# Patient Record
Sex: Male | Born: 1940 | Race: Black or African American | Hispanic: No | Smoking: Never smoker
Health system: Southern US, Community
[De-identification: ages and names within clinical notes are randomized; demographics above are authoritative.]

## PROBLEM LIST (undated history)

## (undated) DIAGNOSIS — K449 Diaphragmatic hernia without obstruction or gangrene: Secondary | ICD-10-CM

## (undated) DIAGNOSIS — F329 Major depressive disorder, single episode, unspecified: Secondary | ICD-10-CM

## (undated) DIAGNOSIS — F32A Depression, unspecified: Secondary | ICD-10-CM

## (undated) DIAGNOSIS — I10 Essential (primary) hypertension: Secondary | ICD-10-CM

## (undated) DIAGNOSIS — H269 Unspecified cataract: Secondary | ICD-10-CM

## (undated) DIAGNOSIS — I341 Nonrheumatic mitral (valve) prolapse: Secondary | ICD-10-CM

## (undated) DIAGNOSIS — C801 Malignant (primary) neoplasm, unspecified: Secondary | ICD-10-CM

## (undated) HISTORY — DX: Nonrheumatic mitral (valve) prolapse: I34.1

## (undated) HISTORY — DX: Unspecified cataract: H26.9

## (undated) HISTORY — DX: Diaphragmatic hernia without obstruction or gangrene: K44.9

## (undated) HISTORY — DX: Essential (primary) hypertension: I10

## (undated) HISTORY — DX: Depression, unspecified: F32.A

## (undated) HISTORY — DX: Major depressive disorder, single episode, unspecified: F32.9

## (undated) HISTORY — DX: Malignant (primary) neoplasm, unspecified: C80.1

---

## 2014-04-22 ENCOUNTER — Inpatient Hospital Stay: Payer: Self-pay | Admitting: Internal Medicine

## 2014-06-12 ENCOUNTER — Ambulatory Visit: Payer: Self-pay | Admitting: Radiation Oncology

## 2014-06-18 ENCOUNTER — Ambulatory Visit: Payer: Self-pay | Admitting: Radiation Oncology

## 2014-06-19 ENCOUNTER — Encounter: Payer: Self-pay | Admitting: *Deleted

## 2014-06-24 ENCOUNTER — Ambulatory Visit (INDEPENDENT_AMBULATORY_CARE_PROVIDER_SITE_OTHER): Payer: Medicare Other | Admitting: General Surgery

## 2014-06-24 ENCOUNTER — Encounter: Payer: Self-pay | Admitting: General Surgery

## 2014-06-24 VITALS — BP 160/62 | HR 57 | Resp 20 | Ht 68.0 in | Wt 136.0 lb

## 2014-06-24 DIAGNOSIS — C20 Malignant neoplasm of rectum: Secondary | ICD-10-CM

## 2014-06-24 MED ORDER — BENZONATATE 100 MG PO CAPS: ORAL_CAPSULE | ORAL | Status: AC

## 2014-06-24 NOTE — Progress Notes (Signed)
Patient ID: James Luna, male   DOB: [birth year], 73 y.o.   MRN: [id number]  Chief Complaint  Patient presents with  . Other    evaluation for port placement    HPI James Luna is a 73 y.o. male who presents for an evaluation of a port placement.   HPI  Past Medical History  Diagnosis Date  . Cancer     colon  . Cataract   . Hypertension   . Depression   . Hiatal hernia   . Mitral valve prolapse     History reviewed. No pertinent past surgical history.  History reviewed. No pertinent family history.  Social History History  Substance Use Topics  . Smoking status: Never Smoker   . Smokeless tobacco: Not on file  . Alcohol Use: No    No Known Allergies  Current Outpatient Prescriptions  Medication Sig Dispense Refill  . b complex vitamins tablet Take 1 tablet by mouth daily.      . furosemide (LASIX) 20 MG tablet 2 tabs by mouth in AM 1 tab in PM      . glipiZIDE (GLUCOTROL) 5 MG tablet Take 5 mg by mouth daily before breakfast.      . latanoprost (XALATAN) 0.005 % ophthalmic solution Place 1 drop into the right eye at bedtime.      Marland Kitchen lisinopril (PRINIVIL,ZESTRIL) 40 MG tablet Take 40 mg by mouth daily.      . metFORMIN (GLUCOPHAGE) 1000 MG tablet Take 1,000 mg by mouth 2 (two) times daily with a meal.      . Multiple Vitamins-Minerals (MULTIVITAMIN PO) Take 1 tablet by mouth daily.      Marland Kitchen POTASSIUM CHLORIDE ER PO Take 10 mEq by mouth daily.      . timolol (BETIMOL) 0.25 % ophthalmic solution Place 1 drop into both eyes daily.      . traMADol (ULTRAM) 50 MG tablet Take 50 mg by mouth every 6 (six) hours as needed.      . benzonatate (TESSALON PERLES) 100 MG capsule Take two pills twice daily  50 capsule  0   No current facility-administered medications for this visit.    Review of Systems Review of Systems  Constitutional: Negative.   Respiratory: Negative.   Cardiovascular: Negative.     Blood pressure 160/62, pulse 57, resp. rate 20, height 5\' 8"   (1.727 m), weight 136 lb (61.689 kg), SpO2 83.00%.  Physical Exam Physical Exam  Constitutional: He is oriented to person, place, and time. He appears well-developed and well-nourished.  Eyes: Conjunctivae are normal. No scleral icterus.  Neck: Neck supple. No thyromegaly present.  Cardiovascular: Normal rate and regular rhythm.   Murmur heard.  Systolic murmur is present with a grade of 5/6  Murmur is best heard over the apex.  Pulmonary/Chest: Breath sounds normal. Accessory muscle usage present.  Abdominal: Soft. Normal appearance and bowel sounds are normal. There is no tenderness.  Lymphadenopathy:    He has no cervical adenopathy.  Neurological: He is alert and oriented to person, place, and time.  Skin: Skin is warm and dry.    Data Reviewed Dr Metro Kung notes, prior PET/CT  Assessment    Pt has a rectal Cancer- reported 2 cm from anal verge, non obstructing, T2,N0,M0. It has ben decided to go with chemo/radiation for now. Port explaine to pt and he is agreeable.    Plan    Patient's surgery has been scheduled for 06-27-14 at [facility].  Anoushka Divito G 06/24/2014, 3:57 PM

## 2014-06-24 NOTE — Patient Instructions (Addendum)
Patient to be scheduled for port placement. The patient is aware to call back for any questions or concerns.  This patient's surgery has been scheduled for 06-27-14 at [facility].

## 2014-06-25 ENCOUNTER — Ambulatory Visit: Payer: Self-pay | Admitting: General Surgery

## 2014-06-25 ENCOUNTER — Telehealth: Payer: Self-pay | Admitting: *Deleted

## 2014-06-25 ENCOUNTER — Ambulatory Visit: Payer: Self-pay | Admitting: Radiation Oncology

## 2014-06-25 NOTE — Telephone Encounter (Signed)
James Luna from [employer] called and had just seen James Luna and they had a list of his medication but when they went over it he stated there was some he did not take anymore. James Luna stated that he seems confused so she feels the best thing for him to do before surgery is not take any medication at all. She told him that and he is aware to not take any medication prior to his surgery.

## 2014-06-27 ENCOUNTER — Encounter: Payer: Self-pay | Admitting: *Deleted

## 2014-06-27 ENCOUNTER — Encounter: Payer: Self-pay | Admitting: General Surgery

## 2014-06-27 ENCOUNTER — Ambulatory Visit: Payer: Self-pay | Admitting: General Surgery

## 2014-06-27 DIAGNOSIS — C20 Malignant neoplasm of rectum: Secondary | ICD-10-CM

## 2014-07-04 ENCOUNTER — Encounter: Payer: Self-pay | Admitting: General Surgery

## 2014-07-08 ENCOUNTER — Ambulatory Visit: Payer: Medicare Other | Admitting: General Surgery

## 2014-07-25 NOTE — Telephone Encounter (Signed)
error 

## 2014-10-13 ENCOUNTER — Other Ambulatory Visit: Payer: Self-pay

## 2014-10-14 ENCOUNTER — Other Ambulatory Visit: Payer: Self-pay | Admitting: Physician Assistant

## 2014-10-14 DIAGNOSIS — I959 Hypotension, unspecified: Secondary | ICD-10-CM

## 2014-10-14 DIAGNOSIS — R7989 Other specified abnormal findings of blood chemistry: Secondary | ICD-10-CM

## 2014-10-14 DIAGNOSIS — I4891 Unspecified atrial fibrillation: Secondary | ICD-10-CM

## 2014-10-14 DIAGNOSIS — I442 Atrioventricular block, complete: Secondary | ICD-10-CM

## 2014-10-14 DIAGNOSIS — I340 Nonrheumatic mitral (valve) insufficiency: Secondary | ICD-10-CM

## 2014-10-25 DEATH — deceased

## 2014-12-09 IMAGING — CT NM PET TUM IMG INITIAL (PI) SKULL BASE T - THIGH
10 series · 25 of 25 positions shown · non-contrast
Comparison: Clinic note of 06/12/2014.

CLINICAL DATA: Initial treatment strategy for rectal cancer initial
staging..

EXAM:
NUCLEAR MEDICINE PET SKULL BASE TO THIGH
TECHNIQUE: 12.4 mCi F-18 FDG was injected intravenously. Full-ring PET imaging
was performed from the skull base to thigh after the radiotracer. CT
data was obtained and used for attenuation correction and anatomic
localization.
FASTING BLOOD GLUCOSE:  Value: 109 mg/dl

[Series 3: ct wb 5.0 b30f · axial · 5.0mm · 0.98mm/px · z∈[-1412,-546]mm · 4 of 290 slices shown]
[im 1/290]
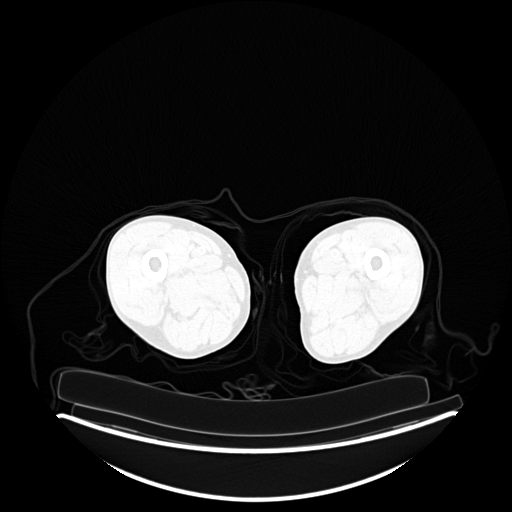
[im 97/290]
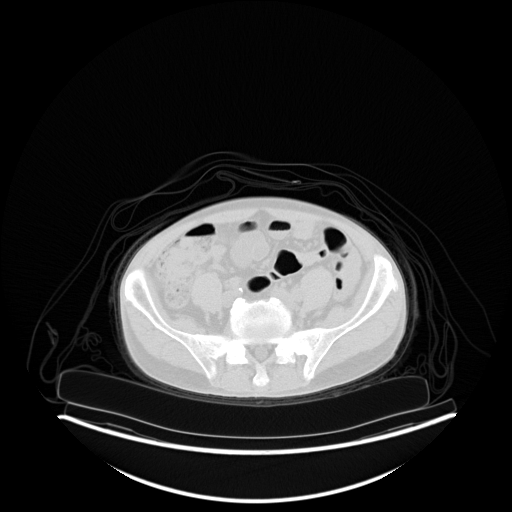
[im 193/290]
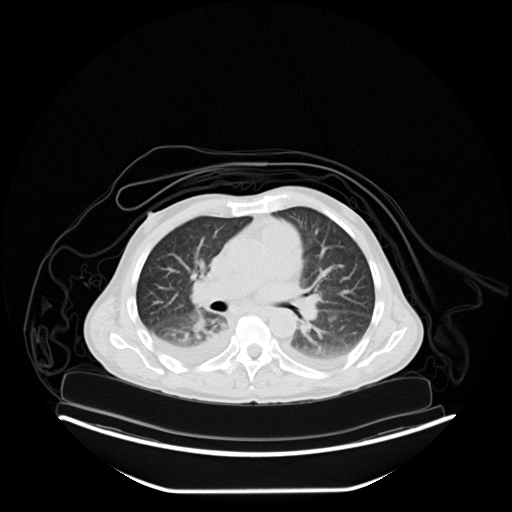
[im 290/290  brain]
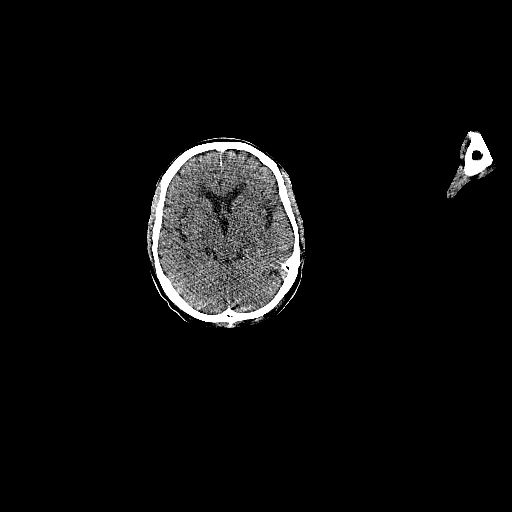

[Series 4: pet wb (ac) · axial · 5.0mm · 4.07mm/px · z∈[-1412,-546]mm · 4 of 290 slices shown]
[im 1/290]
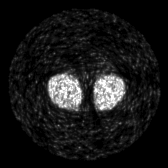
[im 97/290]
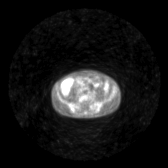
[im 193/290]
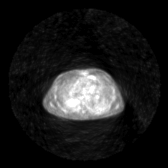
[im 290/290]
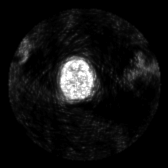

[Series 5: pet wb uncorrected (nac) · axial · 5.0mm · 4.07mm/px · z∈[-1412,-546]mm · 4 of 290 slices shown]
[im 1/290]
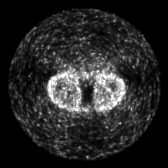
[im 97/290]
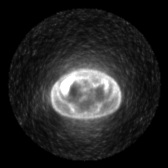
[im 193/290]
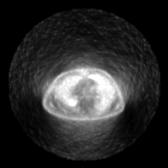
[im 290/290]
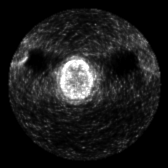

[Series 603: pet axial · 4 of 290 slices shown]
[im 1/290]
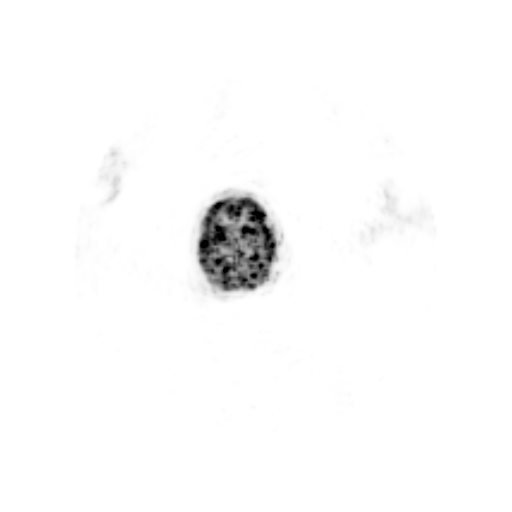
[im 97/290]
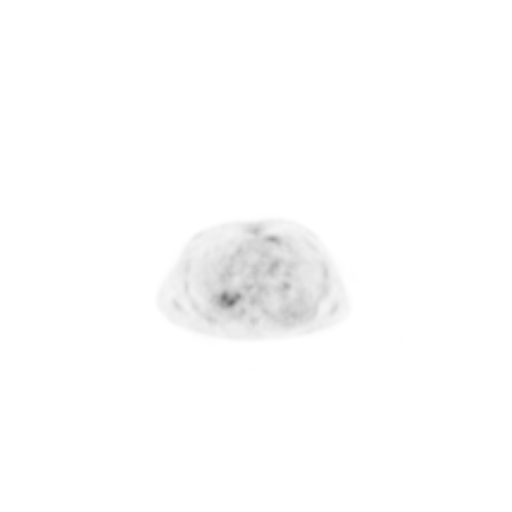
[im 193/290]
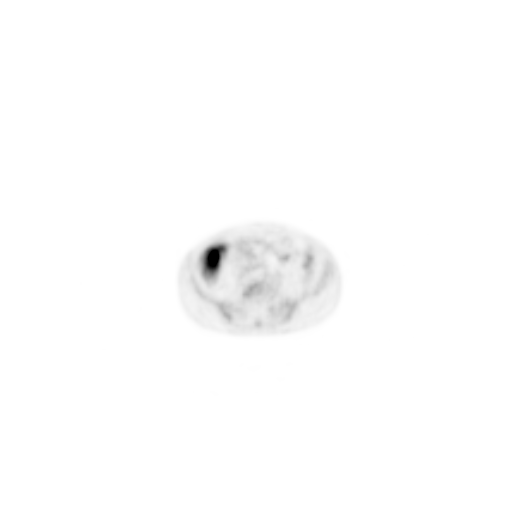
[im 290/290]
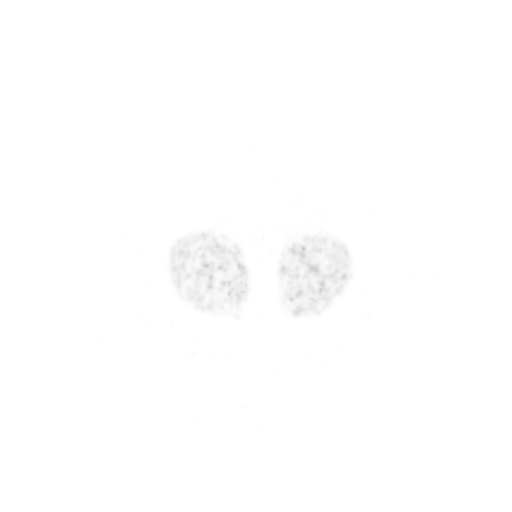

[Series 604: pet coronal · 1 of 86 slices shown]
[im 1/86]
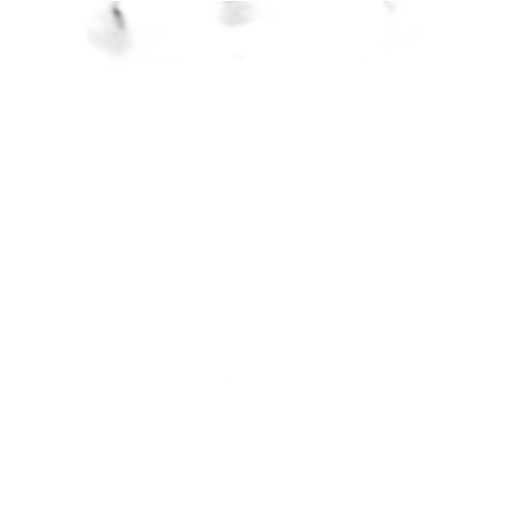

[Series 605: pet sagittal · 1 of 100 slices shown]
[im 1/100]
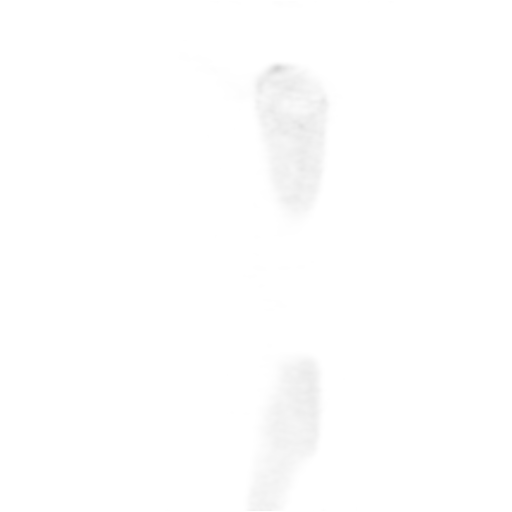

[Series 606: pet/ct axial · 4 of 290 slices shown]
[im 1/290]
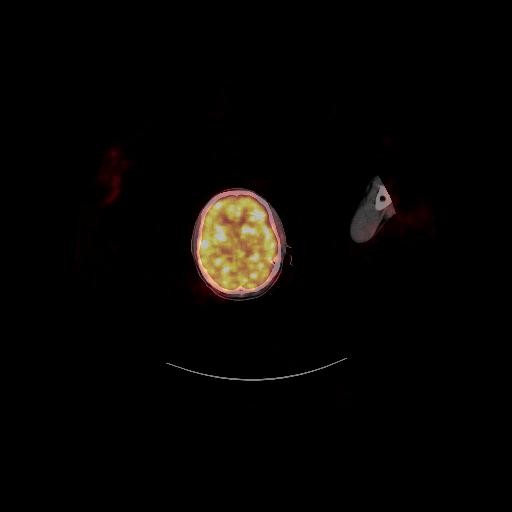
[im 97/290]
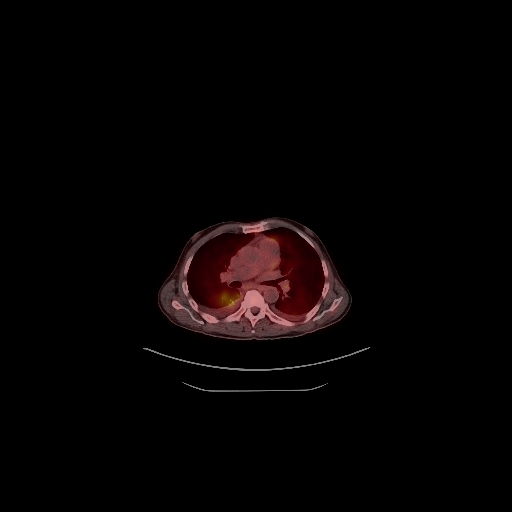
[im 193/290]
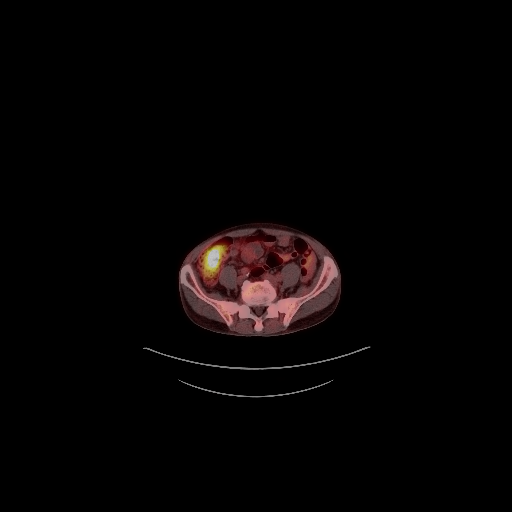
[im 290/290]
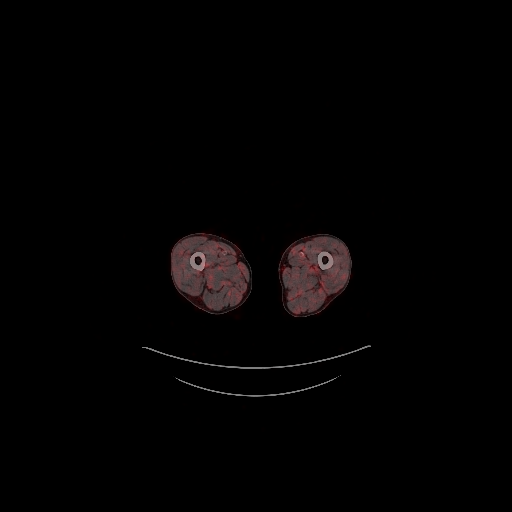

[Series 607: pet/ct coronal · 1 of 64 slices shown]
[im 1/64]
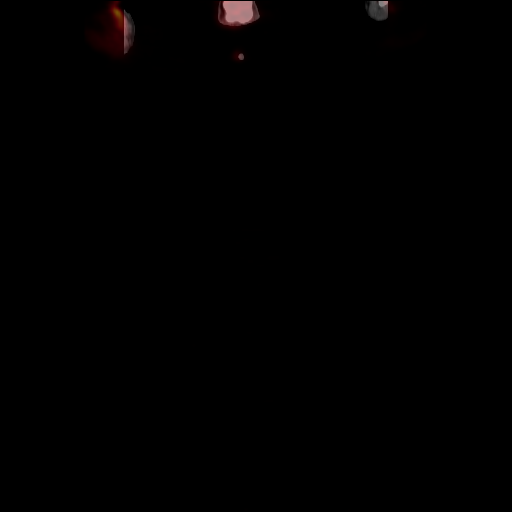

[Series 608: pet/ct sagittal · 1 of 89 slices shown]
[im 1/89]
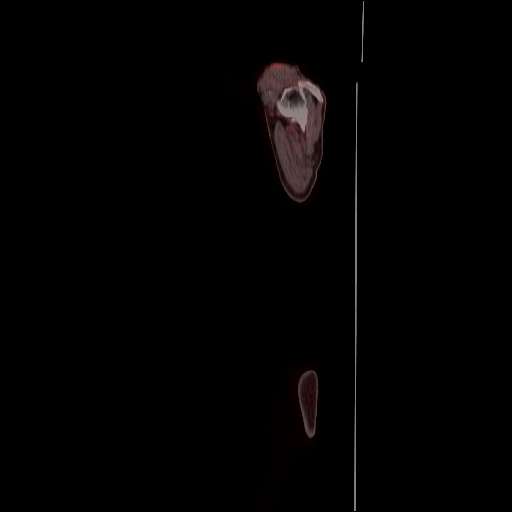

[Series 1038: results mm oncology reading · 3.0mm · 1.12mm/px · 1 of 6 slices shown]
[im 1/6]
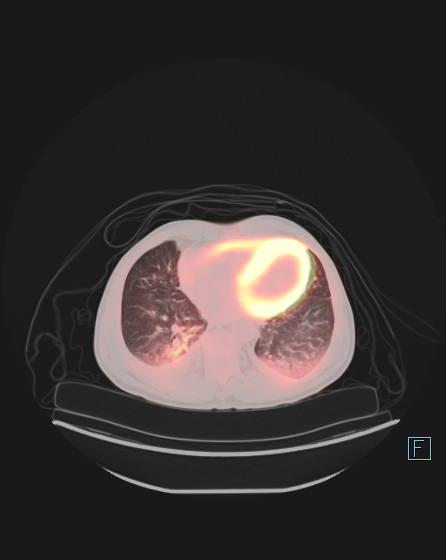

[25 of 25 positions shown; findings below may reference images not displayed]

FINDINGS: NECK

Right low cervical/ supraclavicular node. This measures 9 mm and a
S.U.V. max of 1.9 on image 56.

CHEST

Patchy hypermetabolism throughout both lungs. Concurrent scattered
ground-glass opacities in the upper lobes. Patchy airspace opacities
in the lung bases, greater on the right. No typical findings of
pulmonary metastasis.

ABDOMEN/PELVIS

Hypermetabolism within the cecum and ascending colon, favored to be
physiologic. No dominant mass on CT.

Hypermetabolism within the low rectum and anus, likely representing
the primary lesion. This corresponds to wall thickening, and
measures a S.U.V. max of 15.0, including on image 234. Wall
thickening is eccentric posteriorly and to the left. No obstruction.

Small perirectal nodes which are suspicious based on size and
location, but not significantly hypermetabolic. Example at 5 mm on
image 234/ series 3. Seven o'clock. 5 o'clock position node at 6 mm
on image 229/series 3.

A focus of hypermetabolism within the right side of the prostate
measures a S.U.V. max of 7.0. No CT correlate.

SKELETON

No abnormal marrow activity.

CT IMAGES PERFORMED FOR ATTENUATION CORRECTION

Left maxillary sinus mucous retention cyst or polyp. Carotid
atherosclerosis.

Mild bilateral gynecomastia. Mild cardiomegaly. Multivessel coronary
artery atherosclerosis. Small bilateral pleural effusions.

Small retroperitoneal nodes, without adenopathy.
IMPRESSION: 1. Anorectal primary. Nodes in the sigmoid meso Benson, suspicious
based on location and size. Not significantly hypermetabolic.
2. Right supraclavicular node which demonstrates mild, non malignant
range hypermetabolism. Not in the typical initial drainage pattern
for anal rectal carcinoma. Favor reactive. An atypical appearance of
isolated distant metastasis cannot be excluded. Recommend attention
on follow-up.
3. Pulmonary hypermetabolism with ground-glass and airspace
opacities. Concurrent small bilateral pleural effusions. Suspicious
for pneumonia. Differential considerations include drug toxicity
4. Focus of hypermetabolism in the right-sided the prostate.
Consider physical exam and PSA correlation.
5.  Atherosclerosis, including within the coronary arteries.

## 2015-04-05 IMAGING — CR DG CHEST 1V PORT
1 series · 1 of 1 positions shown · non-contrast
Comparison: 06/27/2014

CLINICAL DATA: Shortness of breath, hypoxia

EXAM:
PORTABLE CHEST - 1 VIEW

[ap]
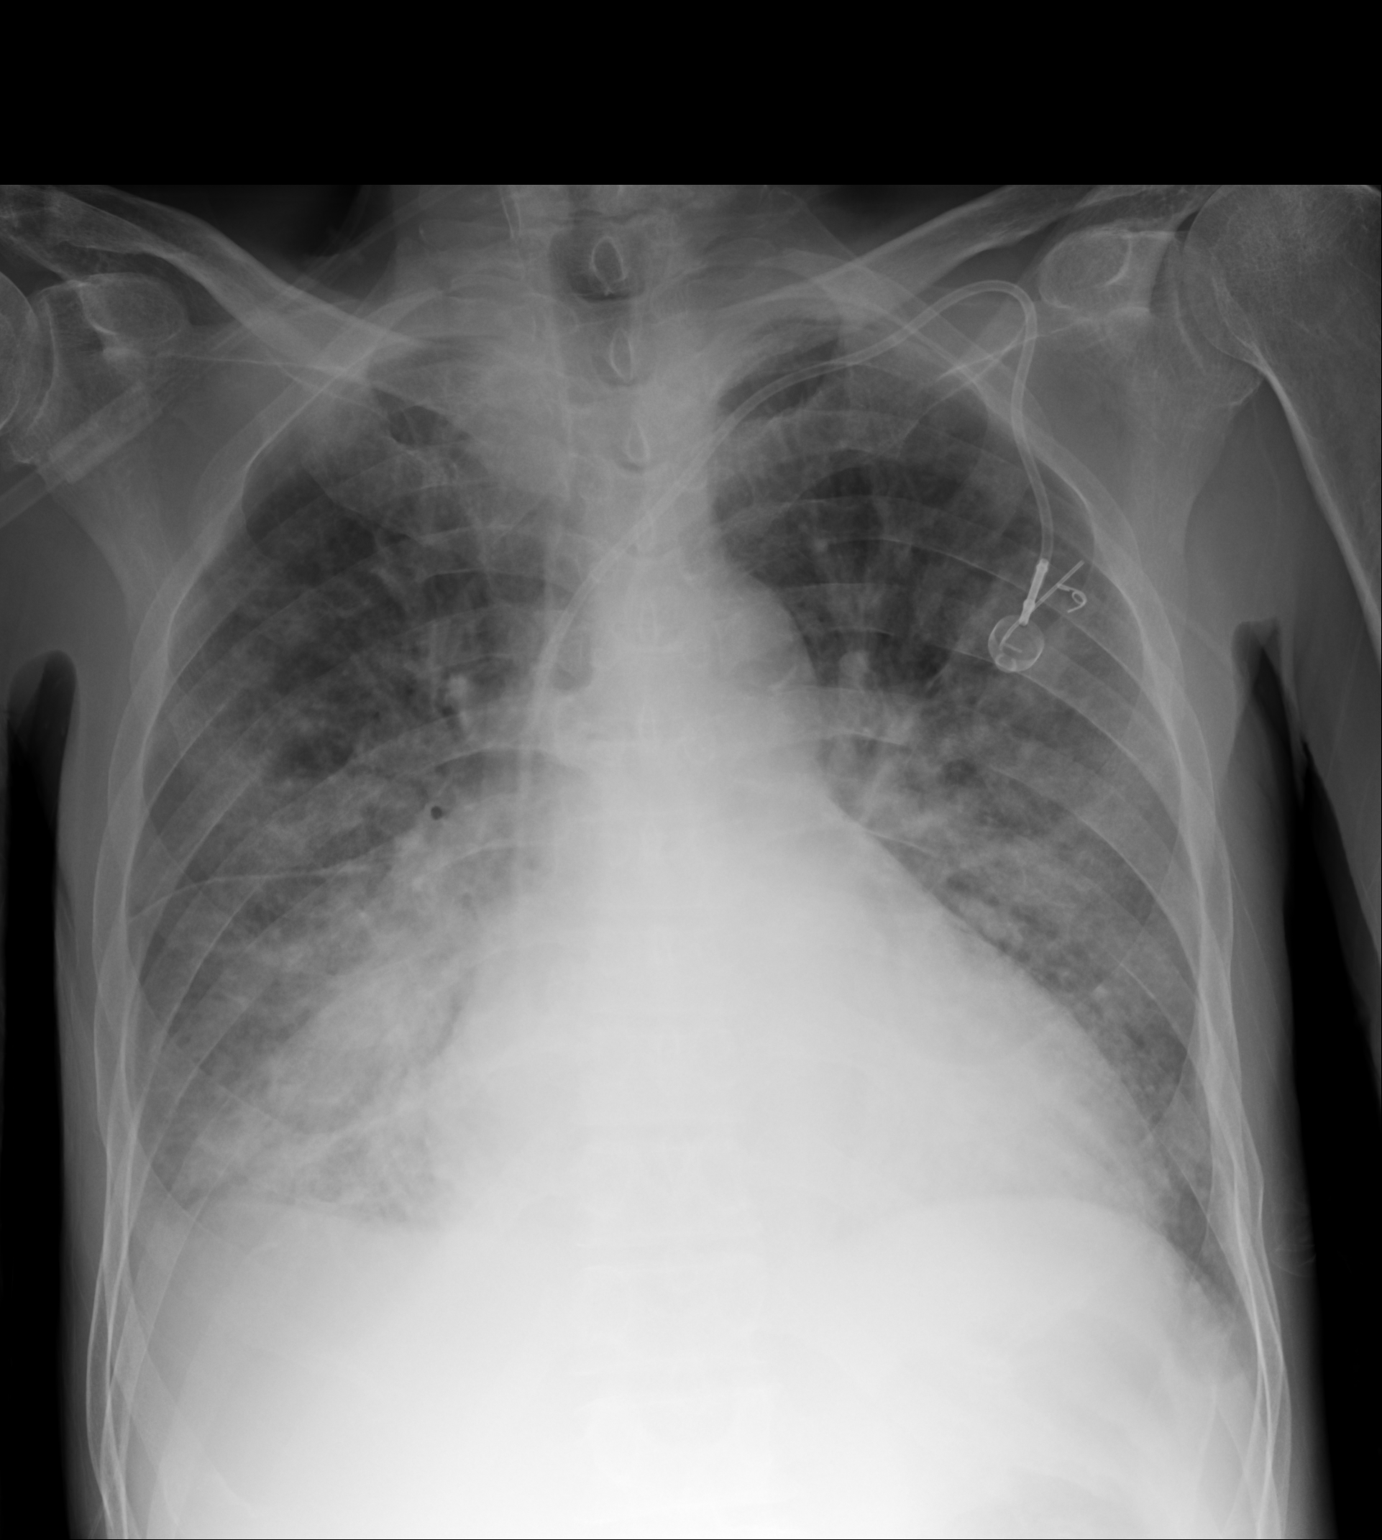

[1 of 1 positions shown; findings below may reference images not displayed]

FINDINGS: Left-sided Port-A-Cath in place with tip at the cavoatrial junction.
Small right and trace left pleural effusions are reidentified. There
is slightly improved aeration overall with persistent bibasilar
airspace consolidation and hazy perihilar airspace opacities. No
pneumothorax. No acute osseous finding. Moderate enlargement of the
cardiac silhouette reidentified.
IMPRESSION: Overall mildly improved lung aeration with persistent patchy
bilateral lower lobe and hazy perihilar airspace opacities. Presence
of hazy perihilar airspace opacities in the setting of cardiomegaly
raises the question of edema, possibly superimposed on chronic
pulmonary parenchymal consolidation.
